# Patient Record
Sex: Female | Born: 1990 | Race: White | Hispanic: No | Marital: Single | Smoking: Former smoker
Health system: Southern US, Community
[De-identification: ages and names within clinical notes are randomized; demographics above are authoritative.]

## PROBLEM LIST (undated history)

## (undated) ENCOUNTER — Inpatient Hospital Stay (HOSPITAL_COMMUNITY): Payer: Self-pay

## (undated) HISTORY — PX: NO PAST SURGERIES: SHX2092

---
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
## 2011-01-30 LAB — GC/CHLAMYDIA PROBE AMP, GENITAL: GC Probe Amp, Genital: NEGATIVE

## 2011-01-30 LAB — URINALYSIS, ROUTINE W REFLEX MICROSCOPIC
Leukocytes, UA: NEGATIVE
Nitrite: NEGATIVE
Specific Gravity, Urine: 1.02 (ref 1.005–1.030)
Urobilinogen, UA: 0.2 mg/dL (ref 0.0–1.0)
pH: 7.5 (ref 5.0–8.0)

## 2011-01-30 LAB — URINE MICROSCOPIC-ADD ON

## 2011-02-18 ENCOUNTER — Inpatient Hospital Stay (HOSPITAL_COMMUNITY)
Admission: AD | Admit: 2011-02-18 | Discharge: 2011-02-21 | DRG: 775 | Disposition: A | Discharge status: Home / Self Care | Payer: 59 | Source: Ambulatory Visit | Attending: Obstetrics and Gynecology | Admitting: Obstetrics and Gynecology

## 2011-02-18 DIAGNOSIS — O4290 Premature rupture of membranes, unspecified as to length of time between rupture and onset of labor, unspecified weeks of gestation: Principal | ICD-10-CM | POA: Diagnosis present

## 2011-02-18 LAB — CBC
Hemoglobin: 12.6 g/dL (ref 12.0–15.0)
MCH: 31.2 pg (ref 26.0–34.0)
MCV: 90.6 fL (ref 78.0–100.0)
RBC: 4.04 MIL/uL (ref 3.87–5.11)
WBC: 10.1 10*3/uL (ref 4.0–10.5)

## 2011-02-18 LAB — RPR: RPR Ser Ql: NONREACTIVE

## 2011-02-20 LAB — CBC
Hemoglobin: 11.7 g/dL — ABNORMAL LOW (ref 12.0–15.0)
MCH: 30.5 pg (ref 26.0–34.0)
MCV: 91.1 fL (ref 78.0–100.0)
Platelets: 177 10*3/uL (ref 150–400)
RBC: 3.84 MIL/uL — ABNORMAL LOW (ref 3.87–5.11)
WBC: 12.8 10*3/uL — ABNORMAL HIGH (ref 4.0–10.5)

## 2011-02-22 LAB — DIFFERENTIAL
Basophils Absolute: 0 10*3/uL (ref 0.0–0.1)
Basophils Relative: 0 % (ref 0–1)
Eosinophils Absolute: 0.1 10*3/uL (ref 0.0–1.2)
Eosinophils Relative: 1 % (ref 0–5)
Monocytes Absolute: 1 10*3/uL (ref 0.2–1.2)
Monocytes Relative: 10 % (ref 3–11)
Neutro Abs: 6.5 10*3/uL (ref 1.7–8.0)

## 2011-02-22 LAB — CBC
HCT: 38 % (ref 36.0–49.0)
Hemoglobin: 13.4 g/dL (ref 12.0–16.0)
MCHC: 35.2 g/dL (ref 31.0–37.0)
MCV: 87.9 fL (ref 78.0–98.0)
RBC: 4.33 MIL/uL (ref 3.80–5.70)
RDW: 12.7 % (ref 11.4–15.5)

## 2011-02-22 LAB — URINALYSIS, ROUTINE W REFLEX MICROSCOPIC
Glucose, UA: NEGATIVE mg/dL
Ketones, ur: 15 mg/dL — AB
Nitrite: NEGATIVE
Specific Gravity, Urine: 1.03 (ref 1.005–1.030)
pH: 6 (ref 5.0–8.0)

## 2011-02-22 LAB — URINE MICROSCOPIC-ADD ON

## 2011-02-22 LAB — POCT I-STAT, CHEM 8
BUN: 6 mg/dL (ref 6–23)
Calcium, Ion: 1.18 mmol/L (ref 1.12–1.32)
Chloride: 105 mEq/L (ref 96–112)
Glucose, Bld: 100 mg/dL — ABNORMAL HIGH (ref 70–99)
HCT: 39 % (ref 36.0–49.0)
Potassium: 3.4 mEq/L — ABNORMAL LOW (ref 3.5–5.1)

## 2011-02-22 LAB — GC/CHLAMYDIA PROBE AMP, GENITAL: GC Probe Amp, Genital: NEGATIVE

## 2011-02-22 LAB — WET PREP, GENITAL

## 2011-10-04 ENCOUNTER — Other Ambulatory Visit (HOSPITAL_COMMUNITY)
Admission: RE | Admit: 2011-10-04 | Discharge: 2011-10-04 | Disposition: A | Discharge status: Home / Self Care | Payer: BC Managed Care – PPO | Source: Ambulatory Visit | Attending: Obstetrics and Gynecology | Admitting: Obstetrics and Gynecology

## 2011-10-04 ENCOUNTER — Other Ambulatory Visit: Payer: Self-pay | Admitting: Obstetrics and Gynecology

## 2011-10-04 DIAGNOSIS — Z01419 Encounter for gynecological examination (general) (routine) without abnormal findings: Secondary | ICD-10-CM | POA: Insufficient documentation

## 2012-03-30 ENCOUNTER — Emergency Department (HOSPITAL_COMMUNITY)
Admission: EM | Admit: 2012-03-30 | Discharge: 2012-03-31 | Disposition: A | Discharge status: Home / Self Care | Payer: Commercial Indemnity | Attending: Emergency Medicine | Admitting: Emergency Medicine

## 2012-03-30 ENCOUNTER — Emergency Department (HOSPITAL_COMMUNITY): Payer: Commercial Indemnity

## 2012-03-30 ENCOUNTER — Encounter (HOSPITAL_COMMUNITY): Payer: Self-pay | Admitting: Emergency Medicine

## 2012-03-30 DIAGNOSIS — R0602 Shortness of breath: Secondary | ICD-10-CM | POA: Insufficient documentation

## 2012-03-30 DIAGNOSIS — J45909 Unspecified asthma, uncomplicated: Secondary | ICD-10-CM | POA: Insufficient documentation

## 2012-03-30 DIAGNOSIS — R110 Nausea: Secondary | ICD-10-CM | POA: Insufficient documentation

## 2012-03-30 DIAGNOSIS — R079 Chest pain, unspecified: Secondary | ICD-10-CM | POA: Insufficient documentation

## 2012-03-30 DIAGNOSIS — Z87891 Personal history of nicotine dependence: Secondary | ICD-10-CM | POA: Insufficient documentation

## 2012-03-30 LAB — DIFFERENTIAL
Basophils Absolute: 0 10*3/uL (ref 0.0–0.1)
Basophils Relative: 0 % (ref 0–1)
Eosinophils Absolute: 0.1 10*3/uL (ref 0.0–0.7)
Monocytes Absolute: 0.9 10*3/uL (ref 0.1–1.0)
Monocytes Relative: 13 % — ABNORMAL HIGH (ref 3–12)
Neutro Abs: 3 10*3/uL (ref 1.7–7.7)
Neutrophils Relative %: 43 % (ref 43–77)

## 2012-03-30 LAB — POCT I-STAT TROPONIN I: Troponin i, poc: 0 ng/mL (ref 0.00–0.08)

## 2012-03-30 LAB — BASIC METABOLIC PANEL
BUN: 14 mg/dL (ref 6–23)
Chloride: 104 mEq/L (ref 96–112)
Creatinine, Ser: 0.78 mg/dL (ref 0.50–1.10)
GFR calc Af Amer: >90 mL/min (ref >90)
GFR calc non Af Amer: >90 mL/min (ref >90)
Potassium: 3.5 mEq/L (ref 3.5–5.1)

## 2012-03-30 LAB — CBC
MCH: 30.4 pg (ref 26.0–34.0)
MCHC: 35.9 g/dL (ref 30.0–36.0)
RDW: 12.7 % (ref 11.5–15.5)

## 2012-03-30 MED ORDER — ONDANSETRON 4 MG PO TBDP
8.0000 mg | ORAL_TABLET | Freq: Once | ORAL | Status: AC
  Administered 2012-03-30: 8 mg via ORAL
  Filled 2012-03-30: qty 2

## 2012-03-30 NOTE — ED Notes (Signed)
Dr. Campos at bedside   

## 2012-03-30 NOTE — ED Notes (Signed)
Patient complaining of mid-sternal chest pain, shortness of breath, dizziness, nausea, and eyelid swelling in the morning for the past two or three days.  Patient also reports tingling in hands and feet.  Patient denies pain at this time; reports light-headedness.  Patient alert and oriented x4; PERRL present.  Upon arrival to room, patient changed into gown.  Will continue to monitor.

## 2012-03-30 NOTE — ED Notes (Signed)
PT. REPORTS MID CHEST PAIN WITH SOB AND NAUSEA / DIZZINESS AND LIGHTHEADED FOR 2 DAYS .

## 2012-03-30 NOTE — ED Provider Notes (Signed)
History     CSN: [id number]  Arrival date & time 03/30/12  2105   First MD Initiated Contact with Patient 03/30/12 2309      Chief Complaint  Patient presents with  . Chest Pain     The history is provided by the patient.   patient reports 3-4 days of nausea lightheadedness and some chest tightness with mild shortness of breath.  She does have a history of asthma.  She used her albuterol inhaler twice a day with some improvement.  She denies wheezing or shortness of breath at this time.  Denies vomiting.  She has melena or hematochezia.  She's had urinary frequency without dysuria.  She's had no missed menstrual cycles.  She denies vaginal bleeding or vaginal discharge at this time.  She's had no syncope.  She simply reports when she stands up she becomes lightheaded and feels dizzy.  Her symptoms are mild in severity.  She has followup appointment scheduled with her primary care Dr. tomorrow however her chest tightness became more severe and she presents the ER for evaluation  Past Medical History  Diagnosis Date  . Asthma     History reviewed. No pertinent past surgical history.  No family history on file.  History  Substance Use Topics  . Smoking status: Former Games developer  . Smokeless tobacco: Not on file  . Alcohol Use: No    OB History    Grav Para Term Preterm Abortions TAB SAB Ect Mult Living                  Review of Systems  Cardiovascular: Positive for chest pain.  All other systems reviewed and are negative.    Allergies  Review of patient's allergies indicates no known allergies.  Home Medications   Current Outpatient Rx  Name Route Sig Dispense Refill  . ONDANSETRON HCL 8 MG PO TABS Oral Take 1 tablet (8 mg total) by mouth every 8 (eight) hours as needed for nausea. 12 tablet 0    BP 112/66  Pulse 72  Temp(Src) 98.9 F (37.2 C) (Oral)  Resp 17  SpO2 98%  LMP 03/28/2012  Physical Exam  Nursing note and vitals reviewed. Constitutional: She is  oriented to person, place, and time. She appears well-developed and well-nourished. No distress.  HENT:  Head: Normocephalic and atraumatic.  Eyes: EOM are normal.  Neck: Normal range of motion.  Cardiovascular: Normal rate, regular rhythm and normal heart sounds.   Pulmonary/Chest: Effort normal and breath sounds normal.  Abdominal: Soft. She exhibits no distension. There is no tenderness.  Musculoskeletal: Normal range of motion.  Neurological: She is alert and oriented to person, place, and time.  Skin: Skin is warm and dry.  Psychiatric: She has a normal mood and affect. Judgment normal.    ED Course  Procedures (including critical care time)   Date: 03/30/2012  Rate: 70  Rhythm: normal sinus rhythm  QRS Axis: normal  Intervals: normal  ST/T Wave abnormalities: normal  Conduction Disutrbances: none  Narrative Interpretation:   Old EKG Reviewed: No prior EKG available      Labs Reviewed  DIFFERENTIAL - Abnormal; Notable for the following:    Monocytes Relative 13 (*)    All other components within normal limits  URINALYSIS, WITH MICROSCOPIC - Abnormal; Notable for the following:    APPearance CLOUDY (*)    Bacteria, UA FEW (*)    All other components within normal limits  CBC  BASIC METABOLIC PANEL  POCT  I-STAT TROPONIN I  PREGNANCY, URINE  LAB REPORT - SCANNED   Dg Chest 2 View  03/30/2012  *RADIOLOGY REPORT*  Clinical Data: Chest pain.  Dizziness.  Nausea.  Shortness of breath.  CHEST - 2 VIEW  Comparison: None.  Findings: Cardiac and mediastinal contours appear normal.  The lungs appear clear.  No pleural effusion is identified.  IMPRESSION:  No significant abnormality identified.  Original Report Authenticated By: Dellia Cloud, M.D.     1. Nausea   2. Chest pain       MDM  Patient has normal labs EKG and chest x-ray.  Her history is not concerning for ACS for pulmonary embolism.  Her vital signs are normal shot sats are 98%.  Urine and urine  pregnancy pending at this time.  The patient is being given oral Zofran for her nausea and she will be orally hydrated.        Lyanne Co, MD 04/01/12 [phone]

## 2012-03-30 NOTE — ED Notes (Signed)
Patient currently sitting up in bed; no respiratory or acute distress noted.  Family present at bedside.  Updated patient on plan of care; informed patient that we are currently waiting on urine results.  Patient has no other questions or concerns; will continue to monitor.

## 2012-03-31 LAB — URINALYSIS, MICROSCOPIC ONLY
Bilirubin Urine: NEGATIVE
Glucose, UA: NEGATIVE mg/dL
Ketones, ur: NEGATIVE mg/dL
Leukocytes, UA: NEGATIVE
Protein, ur: NEGATIVE mg/dL
pH: 7 (ref 5.0–8.0)

## 2012-03-31 MED ORDER — ONDANSETRON HCL 8 MG PO TABS: 8.0000 mg | ORAL_TABLET | Freq: Three times a day (TID) | ORAL | Status: AC | PRN

## 2012-03-31 NOTE — ED Notes (Signed)
Patient given discharge paperwork; went over discharge instructions with patient.  Patient instructed to take zofran as directed, to follow up with her primary care physician, and to return to the ED for new, worsening, or concerning symptoms.

## 2012-03-31 NOTE — ED Notes (Signed)
Patient currently resting quietly in bed; no respiratory or acute distress noted.  Family present at bedside.  No further orders from EDP at this time.  Will continue to monitor.

## 2012-03-31 NOTE — Discharge Instructions (Signed)
Nausea, Adult Nausea is the feeling that you have an upset stomach or have to vomit. Nausea by itself is not likely a serious concern, but it may be an early sign of more serious medical problems. As nausea gets worse, it can lead to vomiting. If vomiting develops, there is the risk of dehydration.  CAUSES   Viral infections.   Food poisoning.   Medicines.   Pregnancy.   Motion sickness.   Migraine headaches.   Emotional distress.   Severe pain from any source.   Alcohol intoxication.  HOME CARE INSTRUCTIONS  Get plenty of rest.   Ask your caregiver about specific rehydration instructions.   Eat small amounts of food and sip liquids more often.   Take all medicines as told by your caregiver.  SEEK MEDICAL CARE IF:  You have not improved after 2 days, or you get worse.   You have a headache.  SEEK IMMEDIATE MEDICAL CARE IF:   You have a fever.   You faint.   You keep vomiting or have blood in your vomit.   You are extremely weak or dehydrated.   You have dark or bloody stools.   You have severe chest or abdominal pain.  MAKE SURE YOU:  Understand these instructions.   Will watch your condition.   Will get help right away if you are not doing well or get worse.  Document Released: 12/13/2004 Document Revised: 10/25/2011 Document Reviewed: 07/18/2011 Exit[employer] Patient Information 2012 [employer]. 

## 2012-11-06 LAB — OB RESULTS CONSOLE RPR: RPR: NONREACTIVE

## 2012-11-11 ENCOUNTER — Other Ambulatory Visit (HOSPITAL_COMMUNITY): Payer: Self-pay | Admitting: Obstetrics and Gynecology

## 2012-11-11 DIAGNOSIS — O3680X0 Pregnancy with inconclusive fetal viability, not applicable or unspecified: Secondary | ICD-10-CM

## 2012-11-17 ENCOUNTER — Ambulatory Visit (HOSPITAL_COMMUNITY)
Admission: RE | Admit: 2012-11-17 | Discharge: 2012-11-17 | Disposition: A | Discharge status: Home / Self Care | Payer: Commercial Indemnity | Source: Ambulatory Visit | Attending: Obstetrics and Gynecology | Admitting: Obstetrics and Gynecology

## 2012-11-17 DIAGNOSIS — O3680X0 Pregnancy with inconclusive fetal viability, not applicable or unspecified: Secondary | ICD-10-CM

## 2012-11-17 DIAGNOSIS — Z3689 Encounter for other specified antenatal screening: Secondary | ICD-10-CM | POA: Insufficient documentation

## 2012-11-19 NOTE — L&D Delivery Note (Signed)
Delivery Note  Cervix was complete at 1949, and pt began pushing shortly after, FHR w early variables, overall remained reassuring   At 8:26 PM a viable female was delivered via Vaginal, Spontaneous Delivery (Presentation: OA ;  ).  Delivered through loose double nuchal cord, shoulders delivered easily APGAR: 8, 9; weight: pending  Placenta status: Intact, Spontaneous.  Cord: 3 vessels with the following complications: None.  Cord pH: n/a   Anesthesia: Epidural  Episiotomy: None Lacerations: Perineal;1st degree R periurethral  Suture Repair: 4-0 monocryl  Est. Blood Loss (mL): 300  Mom to postpartum.  Baby to nursery-stable. Infant remains s-s w mom, both stable in recovery room Pt plans to pump and give breast milk  Routine PP orders   Keyion Knack M 07/03/2013, 9:23 PM

## 2012-12-11 ENCOUNTER — Other Ambulatory Visit: Payer: Self-pay | Admitting: Obstetrics and Gynecology

## 2012-12-11 ENCOUNTER — Other Ambulatory Visit (HOSPITAL_COMMUNITY)
Admission: RE | Admit: 2012-12-11 | Discharge: 2012-12-11 | Disposition: A | Discharge status: Home / Self Care | Payer: Commercial Indemnity | Source: Ambulatory Visit | Attending: Obstetrics and Gynecology | Admitting: Obstetrics and Gynecology

## 2012-12-11 DIAGNOSIS — Z113 Encounter for screening for infections with a predominantly sexual mode of transmission: Secondary | ICD-10-CM | POA: Insufficient documentation

## 2012-12-11 DIAGNOSIS — Z01419 Encounter for gynecological examination (general) (routine) without abnormal findings: Secondary | ICD-10-CM | POA: Insufficient documentation

## 2013-02-09 ENCOUNTER — Inpatient Hospital Stay (HOSPITAL_COMMUNITY)
Admission: AD | Admit: 2013-02-09 | Discharge: 2013-02-10 | Disposition: A | Discharge status: Home / Self Care | Payer: Commercial Indemnity | Source: Ambulatory Visit | Attending: Obstetrics and Gynecology | Admitting: Obstetrics and Gynecology

## 2013-02-09 ENCOUNTER — Inpatient Hospital Stay (HOSPITAL_COMMUNITY): Payer: Commercial Indemnity

## 2013-02-09 ENCOUNTER — Encounter (HOSPITAL_COMMUNITY): Payer: Self-pay | Admitting: *Deleted

## 2013-02-09 DIAGNOSIS — O2390 Unspecified genitourinary tract infection in pregnancy, unspecified trimester: Secondary | ICD-10-CM | POA: Insufficient documentation

## 2013-02-09 DIAGNOSIS — B9689 Other specified bacterial agents as the cause of diseases classified elsewhere: Secondary | ICD-10-CM | POA: Insufficient documentation

## 2013-02-09 DIAGNOSIS — O99891 Other specified diseases and conditions complicating pregnancy: Secondary | ICD-10-CM | POA: Insufficient documentation

## 2013-02-09 DIAGNOSIS — N760 Acute vaginitis: Secondary | ICD-10-CM | POA: Insufficient documentation

## 2013-02-09 DIAGNOSIS — N898 Other specified noninflammatory disorders of vagina: Secondary | ICD-10-CM | POA: Insufficient documentation

## 2013-02-09 DIAGNOSIS — R109 Unspecified abdominal pain: Secondary | ICD-10-CM | POA: Insufficient documentation

## 2013-02-09 DIAGNOSIS — A499 Bacterial infection, unspecified: Secondary | ICD-10-CM | POA: Insufficient documentation

## 2013-02-09 DIAGNOSIS — IMO0002 Reserved for concepts with insufficient information to code with codable children: Secondary | ICD-10-CM | POA: Insufficient documentation

## 2013-02-09 LAB — URINALYSIS, ROUTINE W REFLEX MICROSCOPIC
Leukocytes, UA: NEGATIVE
Nitrite: NEGATIVE
Protein, ur: NEGATIVE mg/dL
Specific Gravity, Urine: 1.025 (ref 1.005–1.030)
Urobilinogen, UA: 1 mg/dL (ref 0.0–1.0)

## 2013-02-09 LAB — WET PREP, GENITAL
Trich, Wet Prep: NONE SEEN
Yeast Wet Prep HPF POC: NONE SEEN

## 2013-02-09 NOTE — MAU Provider Note (Signed)
History     CSN: [id number]  Arrival date and time: 02/09/13 2152   First Provider Initiated Contact with Patient 02/09/13 2245      Chief Complaint  Patient presents with  . Rupture of Membranes  . Abdominal Pain   HPI  Pt is a G2P1001 at 17.5 wks IUP here with reports of leaking watery fluid for the last 3 days, cramping off/on for last 24 hours.  No report of vaginal bleeding.  Also reports swelling in her hands and feet.   Past Medical History  Diagnosis Date  . Asthma     Past Surgical History  Procedure Laterality Date  . No past surgeries      Family History  Problem Relation Age of Onset  . Cancer Mother   . Diabetes Father   . Cancer Sister   . Cancer Maternal Grandmother   . Diabetes Paternal Grandfather     History  Substance Use Topics  . Smoking status: Former Games developer  . Smokeless tobacco: Not on file  . Alcohol Use: No    Allergies: No Known Allergies  No prescriptions prior to admission    Review of Systems  Gastrointestinal: Positive for abdominal pain (cramping).  Genitourinary:       Leaking of fluid  Musculoskeletal:       Hands and feet swelling  Neurological: Positive for headaches.  All other systems reviewed and are negative.   Physical Exam   Blood pressure 118/76, pulse 79, temperature 98.8 F (37.1 C), temperature source Oral, resp. rate 16, height 5\' 5"  (1.651 m), weight 69.4 kg (153 lb), last menstrual period 03/28/2012, SpO2 100.00%.  Physical Exam  Constitutional: She is oriented to person, place, and time. She appears well-developed and well-nourished. No distress.  HENT:  Head: Normocephalic.  Neck: Normal range of motion. Neck supple.  Cardiovascular: Normal rate, regular rhythm and normal heart sounds.   Respiratory: Effort normal and breath sounds normal.  GI: Soft. There is no tenderness.  Genitourinary: No bleeding around the vagina. Vaginal discharge (mucusy) found.  Right lateral wall vaginal cyst  palpated; approx 3 cm, soft, fluctuant.  Musculoskeletal: Normal range of motion. She exhibits no edema.  Neurological: She is alert and oriented to person, place, and time.  Skin: Skin is warm and dry.   Cervix - closed   MAU Course  Procedures  Results for orders placed during the hospital encounter of 02/09/13 (from the past 24 hour(s))  URINALYSIS, ROUTINE W REFLEX MICROSCOPIC     Status: None   Collection Time    02/09/13 10:11 PM      Result Value Range   Color, Urine YELLOW  YELLOW   APPearance CLEAR  CLEAR   Specific Gravity, Urine 1.025  1.005 - 1.030   pH 6.0  5.0 - 8.0   Glucose, UA NEGATIVE  NEGATIVE mg/dL   Hgb urine dipstick NEGATIVE  NEGATIVE   Bilirubin Urine NEGATIVE  NEGATIVE   Ketones, ur NEGATIVE  NEGATIVE mg/dL   Protein, ur NEGATIVE  NEGATIVE mg/dL   Urobilinogen, UA 1.0  0.0 - 1.0 mg/dL   Nitrite NEGATIVE  NEGATIVE   Leukocytes, UA NEGATIVE  NEGATIVE  WET PREP, GENITAL     Status: Abnormal   Collection Time    02/09/13 11:00 PM      Result Value Range   Yeast Wet Prep HPF POC NONE SEEN  NONE SEEN   Trich, Wet Prep NONE SEEN  NONE SEEN   Clue Cells Wet Prep  HPF POC FEW (*) NONE SEEN   WBC, Wet Prep HPF POC FEW (*) NONE SEEN   Ultrasound: IMPRESSION:  1. Single living intrauterine fetus estimated at 18 weeks and 1  day gestation.  2. Normal amniotic fluid.  3. Normal cervical length.  4. Simple appearing vaginal cyst.  Consulted with Dr. Christell Constant > discharge to home Assessment and Plan  Bacterial Vaginosis Vaginal Cyst  Plan: RX Flagyl Follow-up in office.  [facility] 02/09/2013, 10:47 PM

## 2013-02-09 NOTE — MAU Note (Signed)
Pt reports leaking watery fluid for the last 3 days, cramping off/on for last 24 hours, also reports swelling in her hands and feet.

## 2013-02-10 DIAGNOSIS — A499 Bacterial infection, unspecified: Secondary | ICD-10-CM

## 2013-02-10 DIAGNOSIS — N760 Acute vaginitis: Secondary | ICD-10-CM

## 2013-02-10 MED ORDER — METRONIDAZOLE 500 MG PO TABS: 500.0000 mg | ORAL_TABLET | Freq: Two times a day (BID) | ORAL | Status: DC

## 2013-04-17 LAB — OB RESULTS CONSOLE RUBELLA ANTIBODY, IGM: Rubella: IMMUNE

## 2013-04-17 LAB — OB RESULTS CONSOLE RPR: RPR: NONREACTIVE

## 2013-04-17 LAB — OB RESULTS CONSOLE HEPATITIS B SURFACE ANTIGEN: Hepatitis B Surface Ag: NEGATIVE

## 2013-04-28 ENCOUNTER — Encounter (HOSPITAL_COMMUNITY): Payer: Self-pay | Admitting: Obstetrics and Gynecology

## 2013-04-28 ENCOUNTER — Inpatient Hospital Stay (HOSPITAL_COMMUNITY)
Admission: AD | Admit: 2013-04-28 | Discharge: 2013-04-28 | Disposition: A | Discharge status: Home / Self Care | Payer: Commercial Indemnity | Source: Ambulatory Visit | Attending: Obstetrics and Gynecology | Admitting: Obstetrics and Gynecology

## 2013-04-28 DIAGNOSIS — B373 Candidiasis of vulva and vagina: Secondary | ICD-10-CM

## 2013-04-28 DIAGNOSIS — B3731 Acute candidiasis of vulva and vagina: Secondary | ICD-10-CM

## 2013-04-28 DIAGNOSIS — O2390 Unspecified genitourinary tract infection in pregnancy, unspecified trimester: Secondary | ICD-10-CM | POA: Insufficient documentation

## 2013-04-28 DIAGNOSIS — O4702 False labor before 37 completed weeks of gestation, second trimester: Secondary | ICD-10-CM

## 2013-04-28 DIAGNOSIS — O4700 False labor before 37 completed weeks of gestation, unspecified trimester: Secondary | ICD-10-CM | POA: Insufficient documentation

## 2013-04-28 DIAGNOSIS — R109 Unspecified abdominal pain: Secondary | ICD-10-CM | POA: Insufficient documentation

## 2013-04-28 DIAGNOSIS — N949 Unspecified condition associated with female genital organs and menstrual cycle: Secondary | ICD-10-CM | POA: Insufficient documentation

## 2013-04-28 LAB — URINALYSIS, ROUTINE W REFLEX MICROSCOPIC
Bilirubin Urine: NEGATIVE
Glucose, UA: NEGATIVE mg/dL
Hgb urine dipstick: NEGATIVE
Ketones, ur: NEGATIVE mg/dL
Protein, ur: NEGATIVE mg/dL
pH: 7 (ref 5.0–8.0)

## 2013-04-28 LAB — URINE MICROSCOPIC-ADD ON

## 2013-04-28 LAB — WET PREP, GENITAL

## 2013-04-28 LAB — POCT FERN TEST: POCT Fern Test: NEGATIVE

## 2013-04-28 MED ORDER — TERCONAZOLE 0.8 % VA CREA: 1.0000 | TOPICAL_CREAM | Freq: Every day | VAGINAL | Status: DC

## 2013-04-28 NOTE — MAU Provider Note (Signed)
Chief Complaint:  Abdominal Pain and Vaginal Discharge   First Provider Initiated Contact with Patient 04/28/13 2127      HPI: Linda Swanson is a 22 y.o. G2P1001 at [redacted]w[redacted]d who presents to maternity admissions reporting gush of fluid that she thinks may have been urine at noon and cramping since then. Also reports white vaginal discharged x several days. Has it evaluated at the office. Told it was normal. Denies vaginal bleeding, urinary complaints, GI complatins. Good fetal movement.   Past Medical History: Past Medical History  Diagnosis Date  . Asthma     Past obstetric history: OB History   Grav Para Term Preterm Abortions TAB SAB Ect Mult Living   2 1 1       1      # Outc Date GA Lbr Len/2nd Wgt Sex Del Anes PTL Lv   1 TRM 4/12 [redacted]w[redacted]d  4.782NF([id number]) M SVD  Yes Yes   2 CUR               Past Surgical History: Past Surgical History  Procedure Laterality Date  . No past surgeries      Family History: Family History  Problem Relation Age of Onset  . Cancer Mother   . Diabetes Father   . Cancer Sister   . Cancer Maternal Grandmother   . Diabetes Paternal Grandfather     Social History: History  Substance Use Topics  . Smoking status: Former Games developer  . Smokeless tobacco: Not on file  . Alcohol Use: No    Allergies: No Known Allergies  Meds:  Prescriptions prior to admission  Medication Sig Dispense Refill  . Prenatal Vit-Fe Fumarate-FA (PRENATAL MULTIVITAMIN) TABS Take 1 tablet by mouth daily at 12 noon.        ROS: Pertinent findings in history of present illness.  Physical Exam  Blood pressure 121/66, pulse 80, resp. rate 18, last menstrual period 03/28/2012. GENERAL: Well-developed, well-nourished female in no acute distress.  HEENT: normocephalic HEART: normal rate RESP: normal effort ABDOMEN: Soft, non-tender, gravid appropriate for gestational age EXTREMITIES: Nontender, no edema NEURO: alert and oriented SPECULUM EXAM: NEFG, small amount of  thick, white, odorless discharge, no blood, cervix clean  Cervix long, closed, posterior.  FHT:  Baseline 130 , moderate variability, accelerations present, no decelerations Contractions: UI   Labs: Results for orders placed during the hospital encounter of 04/28/13 (from the past 24 hour(s))  WET PREP, GENITAL     Status: Abnormal   Collection Time    04/28/13  9:40 PM      Result Value Range   Yeast Wet Prep HPF POC NONE SEEN  NONE SEEN   Trich, Wet Prep NONE SEEN  NONE SEEN   Clue Cells Wet Prep HPF POC FEW (*) NONE SEEN   WBC, Wet Prep HPF POC MODERATE (*) NONE SEEN  URINALYSIS, ROUTINE W REFLEX MICROSCOPIC     Status: Abnormal   Collection Time    04/28/13 10:15 PM      Result Value Range   Color, Urine YELLOW  YELLOW   APPearance HAZY (*) CLEAR   Specific Gravity, Urine 1.020  1.005 - 1.030   pH 7.0  5.0 - 8.0   Glucose, UA NEGATIVE  NEGATIVE mg/dL   Hgb urine dipstick NEGATIVE  NEGATIVE   Bilirubin Urine NEGATIVE  NEGATIVE   Ketones, ur NEGATIVE  NEGATIVE mg/dL   Protein, ur NEGATIVE  NEGATIVE mg/dL   Urobilinogen, UA 0.2  0.0 - 1.0 mg/dL  Nitrite NEGATIVE  NEGATIVE   Leukocytes, UA SMALL (*) NEGATIVE  URINE MICROSCOPIC-ADD ON     Status: None   Collection Time    04/28/13 10:15 PM      Result Value Range   Squamous Epithelial / LPF RARE  RARE   WBC, UA 0-2  <3 WBC/hpf   RBC / HPF 0-2  <3 RBC/hpf  POCT FERN TEST     Status: None   Collection Time    04/28/13 10:16 PM      Result Value Range   POCT Fern Test Negative = intact amniotic membranes      Imaging:  No results found.  MAU Course:   Assessment: 1. Vulvovaginal candidiasis   2. Preterm contractions, second trimester     Plan: Discharge home per consult w/ Dr. Dion Body. Preterm labor precautions and fetal kick counts Follow-up Information   Follow up with Jessee Avers., MD. (as scheduled)    Contact information:   [address] [place] [address] [phone]        Follow up with [facility] [place] MATERNITY ADMISSIONS. (As needed if symptoms worsen)    Contact information:   [address] [id number] [place] [address] [phone]       Medication List    TAKE these medications       prenatal multivitamin Tabs  Take 1 tablet by mouth daily at 12 noon.     terconazole 0.8 % vaginal cream  Commonly known as:  TERAZOL 3  Place 1 applicator vaginally at bedtime.        [place], CNM 04/28/2013 11:14 PM

## 2013-04-28 NOTE — MAU Note (Signed)
Linda Swanson is here today with complaints of ?ROM, around 12:00 she had a gush of fluid where she thought she urinated on her self. She had to wear a pad. She is [redacted]w[redacted]d. Shortly after she started feeling cramping/pain in her abdomen.

## 2013-05-17 ENCOUNTER — Encounter (HOSPITAL_COMMUNITY): Payer: Self-pay | Admitting: *Deleted

## 2013-05-17 ENCOUNTER — Inpatient Hospital Stay (HOSPITAL_COMMUNITY)
Admission: AD | Admit: 2013-05-17 | Discharge: 2013-05-17 | Disposition: A | Discharge status: Home / Self Care | Payer: Managed Care, Other (non HMO) | Source: Ambulatory Visit | Attending: Obstetrics and Gynecology | Admitting: Obstetrics and Gynecology

## 2013-05-17 DIAGNOSIS — M545 Low back pain, unspecified: Secondary | ICD-10-CM | POA: Insufficient documentation

## 2013-05-17 DIAGNOSIS — O4703 False labor before 37 completed weeks of gestation, third trimester: Secondary | ICD-10-CM

## 2013-05-17 DIAGNOSIS — O479 False labor, unspecified: Secondary | ICD-10-CM

## 2013-05-17 DIAGNOSIS — O4700 False labor before 37 completed weeks of gestation, unspecified trimester: Secondary | ICD-10-CM | POA: Insufficient documentation

## 2013-05-17 LAB — URINALYSIS, ROUTINE W REFLEX MICROSCOPIC
Bilirubin Urine: NEGATIVE
Glucose, UA: NEGATIVE mg/dL
Ketones, ur: >80 mg/dL — AB
Protein, ur: NEGATIVE mg/dL
pH: 7 (ref 5.0–8.0)

## 2013-05-17 LAB — URINE MICROSCOPIC-ADD ON

## 2013-05-17 MED ORDER — NIFEDIPINE 10 MG PO CAPS
10.0000 mg | ORAL_CAPSULE | Freq: Once | ORAL | Status: AC
  Administered 2013-05-17: 10 mg via ORAL
  Filled 2013-05-17: qty 1

## 2013-05-17 MED ORDER — NIFEDIPINE 10 MG PO CAPS
ORAL_CAPSULE | ORAL | Status: AC
  Filled 2013-05-17: qty 1

## 2013-05-17 MED ORDER — DEXTROSE 5 % IN LACTATED RINGERS IV BOLUS
1000.0000 mL | Freq: Once | INTRAVENOUS | Status: AC
  Administered 2013-05-17: 1000 mL via INTRAVENOUS

## 2013-05-17 MED ORDER — NALBUPHINE HCL 10 MG/ML IJ SOLN
10.0000 mg | Freq: Once | INTRAMUSCULAR | Status: AC
  Administered 2013-05-17: 10 mg via INTRAVENOUS
  Filled 2013-05-17: qty 1

## 2013-05-17 MED ORDER — NIFEDIPINE ER OSMOTIC RELEASE 60 MG PO TB24: 60.0000 mg | ORAL_TABLET | Freq: Every day | ORAL | Status: DC

## 2013-05-17 MED ORDER — LACTATED RINGERS IV BOLUS (SEPSIS)
1000.0000 mL | Freq: Once | INTRAVENOUS | Status: AC
  Administered 2013-05-17: 1000 mL via INTRAVENOUS

## 2013-05-17 MED ORDER — NIFEDIPINE 10 MG PO CAPS
10.0000 mg | ORAL_CAPSULE | Freq: Once | ORAL | Status: AC
  Administered 2013-05-17: 10 mg via ORAL

## 2013-05-17 NOTE — MAU Provider Note (Signed)
History     CSN: [id number]  Arrival date and time: 05/17/13 1146   First Provider Initiated Contact with Patient 05/17/13 1212      No chief complaint on file.  HPI 22 y.o. G2P1001 at [redacted]w[redacted]d c/o contractions starting a few hours ago. Mostly feeling low back pain intermittently every few minutes. No bleeding or LOF. Uncomplicated prenatal course.   Past Medical History  Diagnosis Date  . Asthma     Past Surgical History  Procedure Laterality Date  . No past surgeries      Family History  Problem Relation Age of Onset  . Cancer Mother   . Diabetes Father   . Cancer Sister   . Cancer Maternal Grandmother   . Diabetes Paternal Grandfather     History  Substance Use Topics  . Smoking status: Former Games developer  . Smokeless tobacco: Not on file  . Alcohol Use: No    Allergies: No Known Allergies  Prescriptions prior to admission  Medication Sig Dispense Refill  . albuterol (PROVENTIL HFA;VENTOLIN HFA) 108 (90 BASE) MCG/ACT inhaler Inhale 2 puffs into the lungs every 6 (six) hours as needed for wheezing or shortness of breath.      . Prenatal Vit-Fe Fumarate-FA (PRENATAL MULTIVITAMIN) TABS Take 1 tablet by mouth daily.         Review of Systems  Constitutional: Negative.   Respiratory: Negative.   Cardiovascular: Negative.   Gastrointestinal: Negative for nausea, vomiting, abdominal pain, diarrhea and constipation.  Genitourinary: Negative for dysuria, urgency, frequency, hematuria and flank pain.       Negative for vaginal bleeding, + cramping/contractions  Musculoskeletal: Positive for back pain.  Neurological: Negative.   Psychiatric/Behavioral: Negative.    Physical Exam   Blood pressure 115/61, pulse 120, temperature 97.1 F (36.2 C), temperature source Oral, resp. rate 18, last menstrual period 03/28/2012.  Physical Exam  Nursing note and vitals reviewed. Constitutional: She is oriented to person, place, and time. She appears well-developed and  well-nourished. She appears distressed.  Cardiovascular: Normal rate.   Respiratory: Effort normal.  GI: Soft. There is no tenderness.  Genitourinary: No vaginal discharge found.  Dilation: 1.5 Effacement (%): 50 Exam by:: Dorene Grebe, CNM   Musculoskeletal: Normal range of motion.  Neurological: She is alert and oriented to person, place, and time.  Skin: Skin is warm and dry.  Psychiatric: She has a normal mood and affect.   EFM reactive, TOCO UC q 2-3 min  MAU Course  Procedures  Results for orders placed during the hospital encounter of 05/17/13 (from the past 24 hour(s))  FETAL FIBRONECTIN     Status: None   Collection Time    05/17/13 12:00 PM      Result Value Range   Fetal Fibronectin NEGATIVE  NEGATIVE  URINALYSIS, ROUTINE W REFLEX MICROSCOPIC     Status: Abnormal   Collection Time    05/17/13 12:25 PM      Result Value Range   Color, Urine YELLOW  YELLOW   APPearance CLEAR  CLEAR   Specific Gravity, Urine 1.015  1.005 - 1.030   pH 7.0  5.0 - 8.0   Glucose, UA NEGATIVE  NEGATIVE mg/dL   Hgb urine dipstick TRACE (*) NEGATIVE   Bilirubin Urine NEGATIVE  NEGATIVE   Ketones, ur >80 (*) NEGATIVE mg/dL   Protein, ur NEGATIVE  NEGATIVE mg/dL   Urobilinogen, UA 0.2  0.0 - 1.0 mg/dL   Nitrite NEGATIVE  NEGATIVE   Leukocytes, UA SMALL (*) NEGATIVE  URINE MICROSCOPIC-ADD ON     Status: Abnormal   Collection Time    05/17/13 12:25 PM      Result Value Range   Squamous Epithelial / LPF RARE  RARE   WBC, UA 0-2  <3 WBC/hpf   RBC / HPF 0-2  <3 RBC/hpf   Bacteria, UA FEW (*) RARE   Pt feeling better after IV hydration with 1 L LR, Procardia 10 mg PO and Nubain 10 mg IV. 2nd liter of D5LR given d/t >80 ketones on UA. Contractions initally spaced out, then began to return, 2nd dose of Procardia 10 mg PO given. Pt feeling better, contractions subsiding.   Assessment and Plan   1. Preterm contractions, third trimester   FFN negative, UCs resolved. Precautions rev'd. F/U as  scheduled or sooner PRN. Rx Procardia-XL - may take 1 po qday PRN if contractions recur. Encouraged good hydration.     Medication List         albuterol 108 (90 BASE) MCG/ACT inhaler  Commonly known as:  PROVENTIL HFA;VENTOLIN HFA  Inhale 2 puffs into the lungs every 6 (six) hours as needed for wheezing or shortness of breath.     NIFEdipine 60 MG 24 hr tablet  Commonly known as:  PROCARDIA XL/ADALAT-CC  Take 1 tablet (60 mg total) by mouth daily.     prenatal multivitamin Tabs  Take 1 tablet by mouth daily.        Follow-up Information   Follow up with Jessee Avers., MD. (as scheduled)    Contact information:   [address] [place] [address] [phone]         Linda Swanson 05/17/2013, 2:20 PM

## 2013-05-17 NOTE — MAU Note (Signed)
Pt. Had 3 contractions last night at work. Then today around 0930 back pain/contractions began. Here due to increased pain and pressure. Unable to time. Denies blood or fluid leakage. Baby has been moving ok today. Has decreased today but can still feel baby well.

## 2013-06-18 LAB — OB RESULTS CONSOLE GBS: GBS: POSITIVE

## 2013-06-24 ENCOUNTER — Inpatient Hospital Stay (HOSPITAL_COMMUNITY)
Admission: AD | Admit: 2013-06-24 | Discharge: 2013-06-24 | Disposition: A | Discharge status: Home / Self Care | Payer: Managed Care, Other (non HMO) | Source: Ambulatory Visit | Attending: Obstetrics and Gynecology | Admitting: Obstetrics and Gynecology

## 2013-06-24 ENCOUNTER — Encounter (HOSPITAL_COMMUNITY): Payer: Self-pay | Admitting: *Deleted

## 2013-06-24 DIAGNOSIS — O99891 Other specified diseases and conditions complicating pregnancy: Secondary | ICD-10-CM | POA: Insufficient documentation

## 2013-06-24 LAB — AMNISURE RUPTURE OF MEMBRANE (ROM) NOT AT ARMC: Amnisure ROM: NEGATIVE

## 2013-06-24 LAB — POCT FERN TEST: POCT Fern Test: NEGATIVE

## 2013-06-24 NOTE — MAU Note (Signed)
Pt presents with complaints of leakage of fluid that started today around 10 am. States that she kept an eye on the leakage of fluid because she wasn't sure if it was urine or not. She noticed more leakage this evening. Denies any bleeding

## 2013-06-25 ENCOUNTER — Inpatient Hospital Stay (HOSPITAL_COMMUNITY)
Admission: AD | Admit: 2013-06-25 | Discharge: 2013-06-25 | Disposition: A | Discharge status: Home / Self Care | Payer: Managed Care, Other (non HMO) | Source: Ambulatory Visit | Attending: Obstetrics and Gynecology | Admitting: Obstetrics and Gynecology

## 2013-06-25 DIAGNOSIS — O368190 Decreased fetal movements, unspecified trimester, not applicable or unspecified: Secondary | ICD-10-CM

## 2013-06-25 DIAGNOSIS — O368130 Decreased fetal movements, third trimester, not applicable or unspecified: Secondary | ICD-10-CM

## 2013-06-25 DIAGNOSIS — R109 Unspecified abdominal pain: Secondary | ICD-10-CM | POA: Insufficient documentation

## 2013-06-25 NOTE — MAU Note (Signed)
Baby has not been moving as much. Has been having some cramping in pelvis and legs.  Was here last night for ? rom , was ruled out- told might have some spotting after exam; had a glob of blood come out at one time.

## 2013-06-25 NOTE — MAU Provider Note (Signed)
  History     CSN: [id number]  Arrival date and time: 06/25/13 1018   None     Chief Complaint  Patient presents with  . Decreased Fetal Movement   HPI Pt is [redacted]w[redacted]d pregnant and presents with decreased fetal movement over past 2 weeks.  Pt has pressure and has had some vaginal discharge and was here yesterday and was checked for ROM.  No gush of fluid.   Past Medical History  Diagnosis Date  . Asthma     Past Surgical History  Procedure Laterality Date  . No past surgeries      Family History  Problem Relation Age of Onset  . Cancer Mother   . Diabetes Father   . Cancer Sister   . Cancer Maternal Grandmother   . Diabetes Paternal Grandfather     History  Substance Use Topics  . Smoking status: Former Games developer  . Smokeless tobacco: Not on file  . Alcohol Use: No    Allergies: No Known Allergies  Prescriptions prior to admission  Medication Sig Dispense Refill  . albuterol (PROVENTIL HFA;VENTOLIN HFA) 108 (90 BASE) MCG/ACT inhaler Inhale 2 puffs into the lungs every 6 (six) hours as needed for wheezing or shortness of breath.      Marland Kitchen NIFEdipine (PROCARDIA XL/ADALAT-CC) 60 MG 24 hr tablet Take 1 tablet (60 mg total) by mouth daily.  20 tablet  0  . Prenatal Vit-Fe Fumarate-FA (PRENATAL MULTIVITAMIN) TABS Take 1 tablet by mouth daily.         ROS Physical Exam   Blood pressure 122/72, pulse 78, temperature 98 F (36.7 C), temperature source Oral, resp. rate 18, last menstrual period 03/28/2012.  Physical Exam Pt alert and oriented in no acute distress NST reactive with no decelerations; occ ctx  MAU Course  Procedures Discussed NST and pt with Dr. Richardson Dopp Pt may d/c home  Assessment and Plan  Decreased fetal movement Kick counts- f/u for recurrence of decrease FM, pain, bleeding or LOF Keep OB scheduled appt  LINEBERRY,SUSAN 06/25/2013, 11:36 AM ..................[id number]

## 2013-06-25 NOTE — MAU Note (Signed)
Pt states cervix was 3cm yesterday here. Unsure of effacement.

## 2013-07-03 ENCOUNTER — Inpatient Hospital Stay (HOSPITAL_COMMUNITY)
Admission: AD | Admit: 2013-07-03 | Discharge: 2013-07-05 | DRG: 775 | Disposition: A | Discharge status: Home / Self Care | Payer: Managed Care, Other (non HMO) | Source: Ambulatory Visit | Attending: Obstetrics and Gynecology | Admitting: Obstetrics and Gynecology

## 2013-07-03 ENCOUNTER — Encounter (HOSPITAL_COMMUNITY): Payer: Self-pay | Admitting: *Deleted

## 2013-07-03 ENCOUNTER — Encounter (HOSPITAL_COMMUNITY): Payer: Self-pay | Admitting: Anesthesiology

## 2013-07-03 ENCOUNTER — Inpatient Hospital Stay (HOSPITAL_COMMUNITY): Payer: Managed Care, Other (non HMO) | Admitting: Anesthesiology

## 2013-07-03 DIAGNOSIS — O99892 Other specified diseases and conditions complicating childbirth: Secondary | ICD-10-CM | POA: Diagnosis present

## 2013-07-03 DIAGNOSIS — Z22330 Carrier of Group B streptococcus: Secondary | ICD-10-CM

## 2013-07-03 LAB — CBC
HCT: 34.3 % — ABNORMAL LOW (ref 36.0–46.0)
Hemoglobin: 11.8 g/dL — ABNORMAL LOW (ref 12.0–15.0)
MCH: 29.9 pg (ref 26.0–34.0)
MCHC: 34.4 g/dL (ref 30.0–36.0)
MCV: 87.1 fL (ref 78.0–100.0)
Platelets: 239 K/uL (ref 150–400)
RBC: 3.94 MIL/uL (ref 3.87–5.11)
RDW: 12.9 % (ref 11.5–15.5)
WBC: 10.4 K/uL (ref 4.0–10.5)

## 2013-07-03 MED ORDER — ACETAMINOPHEN 325 MG PO TABS: 650.0000 mg | ORAL_TABLET | ORAL | Status: DC | PRN

## 2013-07-03 MED ORDER — TERBUTALINE SULFATE 1 MG/ML IJ SOLN: 0.2500 mg | Freq: Once | INTRAMUSCULAR | Status: DC | PRN

## 2013-07-03 MED ORDER — LACTATED RINGERS IV SOLN: INTRAVENOUS | Status: DC

## 2013-07-03 MED ORDER — ONDANSETRON HCL 4 MG/2ML IJ SOLN: 4.0000 mg | Freq: Four times a day (QID) | INTRAMUSCULAR | Status: DC | PRN

## 2013-07-03 MED ORDER — TETANUS-DIPHTH-ACELL PERTUSSIS 5-2.5-18.5 LF-MCG/0.5 IM SUSP
0.5000 mL | Freq: Once | INTRAMUSCULAR | Status: AC
  Administered 2013-07-04: 0.5 mL via INTRAMUSCULAR

## 2013-07-03 MED ORDER — LIDOCAINE HCL (PF) 1 % IJ SOLN
INTRAMUSCULAR | Status: DC | PRN
  Administered 2013-07-03 (×2): 5 mL

## 2013-07-03 MED ORDER — OXYTOCIN 40 UNITS IN LACTATED RINGERS INFUSION - SIMPLE MED
1.0000 m[IU]/min | INTRAVENOUS | Status: DC
  Administered 2013-07-03: 4 m[IU]/min via INTRAVENOUS
  Administered 2013-07-03: 6 m[IU]/min via INTRAVENOUS
  Administered 2013-07-03: 2 m[IU]/min via INTRAVENOUS
  Administered 2013-07-03: 8 m[IU]/min via INTRAVENOUS
  Administered 2013-07-03: 10 m[IU]/min via INTRAVENOUS
  Administered 2013-07-03: 16 m[IU]/min via INTRAVENOUS

## 2013-07-03 MED ORDER — FLEET ENEMA 7-19 GM/118ML RE ENEM: 1.0000 | ENEMA | RECTAL | Status: DC | PRN

## 2013-07-03 MED ORDER — SIMETHICONE 80 MG PO CHEW: 80.0000 mg | CHEWABLE_TABLET | ORAL | Status: DC | PRN

## 2013-07-03 MED ORDER — PRENATAL MULTIVITAMIN CH: 1.0000 | ORAL_TABLET | Freq: Every day | ORAL | Status: DC

## 2013-07-03 MED ORDER — ONDANSETRON HCL 4 MG PO TABS: 4.0000 mg | ORAL_TABLET | ORAL | Status: DC | PRN

## 2013-07-03 MED ORDER — OXYCODONE-ACETAMINOPHEN 5-325 MG PO TABS
1.0000 | ORAL_TABLET | ORAL | Status: DC | PRN
  Administered 2013-07-04: 1 via ORAL
  Filled 2013-07-03: qty 1

## 2013-07-03 MED ORDER — PHENYLEPHRINE 40 MCG/ML (10ML) SYRINGE FOR IV PUSH (FOR BLOOD PRESSURE SUPPORT)
80.0000 ug | PREFILLED_SYRINGE | INTRAVENOUS | Status: DC | PRN
  Filled 2013-07-03: qty 5

## 2013-07-03 MED ORDER — WITCH HAZEL-GLYCERIN EX PADS: 1.0000 "application " | MEDICATED_PAD | CUTANEOUS | Status: DC | PRN

## 2013-07-03 MED ORDER — LIDOCAINE HCL (PF) 1 % IJ SOLN
30.0000 mL | INTRAMUSCULAR | Status: DC | PRN
  Administered 2013-07-03: 30 mL via SUBCUTANEOUS
  Filled 2013-07-03 (×2): qty 30

## 2013-07-03 MED ORDER — MEDROXYPROGESTERONE ACETATE 150 MG/ML IM SUSP: 150.0000 mg | INTRAMUSCULAR | Status: DC | PRN

## 2013-07-03 MED ORDER — DIPHENHYDRAMINE HCL 25 MG PO CAPS: 25.0000 mg | ORAL_CAPSULE | Freq: Four times a day (QID) | ORAL | Status: DC | PRN

## 2013-07-03 MED ORDER — SENNOSIDES-DOCUSATE SODIUM 8.6-50 MG PO TABS
2.0000 | ORAL_TABLET | Freq: Every day | ORAL | Status: DC
  Administered 2013-07-04 (×2): 2 via ORAL

## 2013-07-03 MED ORDER — FENTANYL 2.5 MCG/ML BUPIVACAINE 1/10 % EPIDURAL INFUSION (WH - ANES)
14.0000 mL/h | INTRAMUSCULAR | Status: DC | PRN
  Administered 2013-07-03: 14 mL/h via EPIDURAL
  Filled 2013-07-03: qty 125

## 2013-07-03 MED ORDER — ZOLPIDEM TARTRATE 5 MG PO TABS: 5.0000 mg | ORAL_TABLET | Freq: Every evening | ORAL | Status: DC | PRN

## 2013-07-03 MED ORDER — FLEET ENEMA 7-19 GM/118ML RE ENEM: 1.0000 | ENEMA | Freq: Every day | RECTAL | Status: DC | PRN

## 2013-07-03 MED ORDER — ONDANSETRON HCL 4 MG/2ML IJ SOLN: 4.0000 mg | INTRAMUSCULAR | Status: DC | PRN

## 2013-07-03 MED ORDER — PENICILLIN G POTASSIUM 5000000 UNITS IJ SOLR
2.5000 10*6.[IU] | INTRAVENOUS | Status: DC
  Administered 2013-07-03: 2.5 10*6.[IU] via INTRAVENOUS
  Filled 2013-07-03 (×4): qty 2.5

## 2013-07-03 MED ORDER — LANOLIN HYDROUS EX OINT: TOPICAL_OINTMENT | CUTANEOUS | Status: DC | PRN

## 2013-07-03 MED ORDER — MISOPROSTOL 200 MCG PO TABS
800.0000 ug | ORAL_TABLET | Freq: Once | ORAL | Status: AC
  Administered 2013-07-03: 800 ug via RECTAL

## 2013-07-03 MED ORDER — EPHEDRINE 5 MG/ML INJ: 10.0000 mg | INTRAVENOUS | Status: DC | PRN

## 2013-07-03 MED ORDER — OXYCODONE-ACETAMINOPHEN 5-325 MG PO TABS: 1.0000 | ORAL_TABLET | ORAL | Status: DC | PRN

## 2013-07-03 MED ORDER — DIBUCAINE 1 % RE OINT: 1.0000 "application " | TOPICAL_OINTMENT | RECTAL | Status: DC | PRN

## 2013-07-03 MED ORDER — MEASLES, MUMPS & RUBELLA VAC ~~LOC~~ INJ: 0.5000 mL | INJECTION | Freq: Once | SUBCUTANEOUS | Status: DC

## 2013-07-03 MED ORDER — PRENATAL MULTIVITAMIN CH
1.0000 | ORAL_TABLET | Freq: Every day | ORAL | Status: DC
  Administered 2013-07-04 – 2013-07-05 (×2): 1 via ORAL
  Filled 2013-07-03 (×2): qty 1

## 2013-07-03 MED ORDER — DIPHENHYDRAMINE HCL 50 MG/ML IJ SOLN: 12.5000 mg | INTRAMUSCULAR | Status: DC | PRN

## 2013-07-03 MED ORDER — IBUPROFEN 600 MG PO TABS
600.0000 mg | ORAL_TABLET | Freq: Four times a day (QID) | ORAL | Status: DC
  Administered 2013-07-04 – 2013-07-05 (×6): 600 mg via ORAL
  Filled 2013-07-03 (×6): qty 1

## 2013-07-03 MED ORDER — OXYTOCIN BOLUS FROM INFUSION: 500.0000 mL | INTRAVENOUS | Status: DC

## 2013-07-03 MED ORDER — LACTATED RINGERS IV SOLN
500.0000 mL | INTRAVENOUS | Status: DC | PRN
  Administered 2013-07-03: 1000 mL via INTRAVENOUS

## 2013-07-03 MED ORDER — PHENYLEPHRINE 40 MCG/ML (10ML) SYRINGE FOR IV PUSH (FOR BLOOD PRESSURE SUPPORT): 80.0000 ug | PREFILLED_SYRINGE | INTRAVENOUS | Status: DC | PRN

## 2013-07-03 MED ORDER — EPHEDRINE 5 MG/ML INJ
10.0000 mg | INTRAVENOUS | Status: DC | PRN
  Filled 2013-07-03: qty 4

## 2013-07-03 MED ORDER — PENICILLIN G POTASSIUM 5000000 UNITS IJ SOLR
5.0000 10*6.[IU] | Freq: Once | INTRAMUSCULAR | Status: AC
  Administered 2013-07-03: 5 10*6.[IU] via INTRAVENOUS
  Filled 2013-07-03: qty 5

## 2013-07-03 MED ORDER — IBUPROFEN 600 MG PO TABS: 600.0000 mg | ORAL_TABLET | Freq: Four times a day (QID) | ORAL | Status: DC | PRN

## 2013-07-03 MED ORDER — MISOPROSTOL 200 MCG PO TABS
ORAL_TABLET | ORAL | Status: AC
  Filled 2013-07-03: qty 4

## 2013-07-03 MED ORDER — BISACODYL 10 MG RE SUPP: 10.0000 mg | Freq: Every day | RECTAL | Status: DC | PRN

## 2013-07-03 MED ORDER — LACTATED RINGERS IV SOLN
500.0000 mL | Freq: Once | INTRAVENOUS | Status: AC
  Administered 2013-07-03: 500 mL via INTRAVENOUS

## 2013-07-03 MED ORDER — OXYTOCIN 40 UNITS IN LACTATED RINGERS INFUSION - SIMPLE MED
62.5000 mL/h | INTRAVENOUS | Status: DC
  Filled 2013-07-03: qty 1000

## 2013-07-03 MED ORDER — BENZOCAINE-MENTHOL 20-0.5 % EX AERO
1.0000 "application " | INHALATION_SPRAY | CUTANEOUS | Status: DC | PRN
  Administered 2013-07-04: 1 via TOPICAL
  Filled 2013-07-03: qty 56

## 2013-07-03 MED ORDER — CITRIC ACID-SODIUM CITRATE 334-500 MG/5ML PO SOLN: 30.0000 mL | ORAL | Status: DC | PRN

## 2013-07-03 NOTE — MAU Note (Signed)
Patient states she had a gush of clear fluid at 0830 that continues with sneezing or coughing. Having a lot of back pain and pressure, not sure if contractions. States baby is moving but less than usual.

## 2013-07-03 NOTE — Anesthesia Preprocedure Evaluation (Signed)
Anesthesia Evaluation  Patient identified by MRN, date of birth, ID band Patient awake    Reviewed: Allergy & Precautions, H&P , NPO status , Patient's Chart, lab work & pertinent test results  Airway Mallampati: II      Dental no notable dental hx.    Pulmonary neg pulmonary ROS, asthma ,  breath sounds clear to auscultation  Pulmonary exam normal       Cardiovascular Exercise Tolerance: Good negative cardio ROS  Rhythm:regular Rate:Normal     Neuro/Psych negative neurological ROS  negative psych ROS   GI/Hepatic negative GI ROS, Neg liver ROS,   Endo/Other  negative endocrine ROS  Renal/GU negative Renal ROS  negative genitourinary   Musculoskeletal   Abdominal Normal abdominal exam  (+)   Peds  Hematology negative hematology ROS (+)   Anesthesia Other Findings   Reproductive/Obstetrics (+) Pregnancy                           Anesthesia Physical Anesthesia Plan  ASA: II  Anesthesia Plan: Epidural   Post-op Pain Management:    Induction:   Airway Management Planned:   Additional Equipment:   Intra-op Plan:   Post-operative Plan:   Informed Consent: I have reviewed the patients History and Physical, chart, labs and discussed the procedure including the risks, benefits and alternatives for the proposed anesthesia with the patient or authorized representative who has indicated his/her understanding and acceptance.     Plan Discussed with: Anesthesiologist, CRNA and Surgeon  Anesthesia Plan Comments:         Anesthesia Quick Evaluation

## 2013-07-03 NOTE — Anesthesia Procedure Notes (Signed)
Epidural Patient location during procedure: OB Start time: 07/03/2013 5:09 PM  Staffing Anesthesiologist: Angus Seller., Harrell Gave. Performed by: anesthesiologist   Preanesthetic Checklist Completed: patient identified, site marked, surgical consent, pre-op evaluation, timeout performed, IV checked, risks and benefits discussed and monitors and equipment checked  Epidural Patient position: sitting Prep: site prepped and draped and DuraPrep Patient monitoring: continuous pulse ox and blood pressure Approach: midline Injection technique: LOR air and LOR saline  Needle:  Needle type: Tuohy  Needle gauge: 17 G Needle length: 9 cm and 9 Needle insertion depth: 5 cm cm Catheter type: closed end flexible Catheter size: 19 Gauge Catheter at skin depth: 10 cm Test dose: negative  Assessment Events: blood not aspirated, injection not painful, no injection resistance, negative IV test and no paresthesia  Additional Notes Patient identified.  Risk benefits discussed including failed block, incomplete pain control, headache, nerve damage, paralysis, blood pressure changes, nausea, vomiting, reactions to medication both toxic or allergic, and postpartum back pain.  Patient expressed understanding and wished to proceed.  All questions were answered.  Sterile technique used throughout procedure and epidural site dressed with sterile barrier dressing. No paresthesia or other complications noted.The patient did not experience any signs of intravascular injection such as tinnitus or metallic taste in mouth nor signs of intrathecal spread such as rapid motor block. Please see nursing notes for vital signs.

## 2013-07-03 NOTE — H&P (Signed)
Linda Swanson is a 22 y.o. female G2 P1001 at 38 wks and 2 days  presenting   Complaining of  Contractions every 2-4 minutes. She felt a gush of fluid at 8 am however her amniosure was negative. She changed from 3.5 to 4.5 in MAU. + fm bloody show.   History OB History   Grav Para Term Preterm Abortions TAB SAB Ect Mult Living   2 1 1       1      Past Medical History  Diagnosis Date  . Asthma    Past Surgical History  Procedure Laterality Date  . No past surgeries     Family History: family history includes Cancer in her maternal grandmother, mother, and sister; Diabetes in her father and paternal grandfather. Social History:  reports that she has quit smoking. She does not have any smokeless tobacco history on file. She reports that she does not drink alcohol or use illicit drugs.   Prenatal Transfer Tool  Maternal Diabetes: No Genetic Screening: Normal Maternal Ultrasounds/Referrals: Normal Fetal Ultrasounds or other Referrals:  None Maternal Substance Abuse:  No Significant Maternal Medications:  None Significant Maternal Lab Results:  Lab values include: Group B Strep positive Chamydia positive 06/18/2013.Marland Kitchen Pt treated 06/2013...  Other Comments:  None  ROS  Dilation: 4.5 Effacement (%): 70 Station: -2 Exam by:: Arbie Cookey, CNM Blood pressure 112/69, pulse 66, temperature 97.9 F (36.6 C), temperature source Oral, resp. rate 18, height 5\' 4"  (1.626 m), weight 78.019 kg (172 lb), last menstrual period 03/28/2012, SpO2 100.00%. Exam Physical Exam  Vitals reviewed. Constitutional: She is oriented to person, place, and time. She appears well-developed and well-nourished.  Neck: Normal range of motion.  Cardiovascular: Normal rate and regular rhythm.   Respiratory: Effort normal and breath sounds normal.  GI: There is no tenderness.  Genitourinary: Vagina normal and uterus normal.  Musculoskeletal: Normal range of motion.  Neurological: She is alert and oriented  to person, place, and time.  Skin: Skin is warm and dry.  Psychiatric: She has a normal mood and affect.    Category 1 tracing ...   Prenatal labs: ABO, Rh:   O positive  Antibody:    Negative  Rubella:  Immune  RPR:   Nonreactive  HBsAg:   Negative  HIV:   Nonreactive  GBS: Positive (07/31 0000)   Assessment/Plan: 38 wks and 2 days with Labor...  Pitocin for augmentation. Anticipate svd  GBS Positive.  Penicillin for prophylaxis    chlamydia positive treated with Azithromycin  06/29/2013...   Roald Lukacs J. 07/03/2013, 12:54 PM

## 2013-07-03 NOTE — Progress Notes (Signed)
Linda Swanson is a 22 y.o. G2P1001 at [redacted]w[redacted]d by  5 wk 5 day u/s with [facility] 07/15/2013 admitted for active labor  Subjective:  Pt comfortable with epidural. +FM   Objective: BP 114/64  Pulse 61  Temp(Src) 97.9 F (36.6 C) (Oral)  Resp 18  Ht 5\' 4"  (1.626 m)  Wt 78.019 kg (172 lb)  BMI 29.51 kg/m2  SpO2 100%  LMP 03/28/2012      FHT:  FHR: 130 bpm, variability: moderate,  accelerations:  Present,  decelerations:  Absent UC:   regular, every 2 minutes SVE:   Dilation: 6 Effacement (%): 70 Station: -2 Exam by:: Roshini Fulwider  IUPC placed without difficulty   Labs: Lab Results  Component Value Date   WBC 10.4 07/03/2013   HGB 11.8* 07/03/2013   HCT 34.3* 07/03/2013   MCV 87.1 07/03/2013   PLT 239 07/03/2013    Assessment / Plan: Augmentation of labor, progressing well  Labor: Progressing on Pitocin Preeclampsia:  NA Fetal Wellbeing:  Category I Pain Control:  Epidural I/D:  Penicillin Anticipated MOD:  NSVD  [name] 07/03/2013, 5:54 PM

## 2013-07-03 NOTE — Progress Notes (Signed)
Linda Swanson is a 22 y.o. G2P1001 at [redacted]w[redacted]d by ultrasound admitted for active labor  Subjective:  Feeling pressure . Comfortable with epidural   Objective: BP 103/61  Pulse 74  Temp(Src) 98.2 F (36.8 C) (Oral)  Resp 18  Ht 5\' 4"  (1.626 m)  Wt 78.019 kg (172 lb)  BMI 29.51 kg/m2  SpO2 100%  LMP 03/28/2012      FHT:  FHR: 130 bpm, variability: moderate,  accelerations:  Present,  decelerations:  Absent UC:   regular, every 2 minutes SVE:   Dilation: 7 Effacement (%): 80 Station: -2 Exam by:: [employer]: Lab Results  Component Value Date   WBC 10.4 07/03/2013   HGB 11.8* 07/03/2013   HCT 34.3* 07/03/2013   MCV 87.1 07/03/2013   PLT 239 07/03/2013    Assessment / Plan: Augmentation of labor, progressing well  Labor: Progressing on pitocin  Preeclampsia:  NA Fetal Wellbeing:  Category I Pain Control:  Epidural I/D:  Penicillin  Anticipated MOD:  NSVD [facility] midwife Lillard and Dr. Su Hilt covering after 7pm  Pershing Skidmore J. 07/03/2013, 6:57 PM

## 2013-07-04 LAB — CBC
HCT: 29.1 % — ABNORMAL LOW (ref 36.0–46.0)
Hemoglobin: 10 g/dL — ABNORMAL LOW (ref 12.0–15.0)
MCHC: 34.4 g/dL (ref 30.0–36.0)
MCV: 87.4 fL (ref 78.0–100.0)
RDW: 13.1 % (ref 11.5–15.5)

## 2013-07-04 MED ORDER — PNEUMOCOCCAL VAC POLYVALENT 25 MCG/0.5ML IJ INJ
0.5000 mL | INJECTION | INTRAMUSCULAR | Status: DC
  Filled 2013-07-04: qty 0.5

## 2013-07-04 NOTE — Anesthesia Postprocedure Evaluation (Signed)
Anesthesia Post Note  Patient: Linda Swanson  Procedure(s) Performed: * No procedures listed *  Anesthesia type: Epidural  Patient location: Mother/Baby  Post pain: Pain level controlled  Post assessment: Post-op Vital signs reviewed  Last Vitals:  Filed Vitals:   07/04/13 0639  BP: 95/64  Pulse: 70  Temp: 36.7 C  Resp: 16    Post vital signs: Reviewed  Level of consciousness:alert  Complications: No apparent anesthesia complications

## 2013-07-04 NOTE — Progress Notes (Signed)
Post Partum Day 1 Subjective: Reports feeling well.  Ambulating, voiding and tol po liquids and solids without difficulty.  Denies weakness or dizziness.  Pos flatus, neg BM.  Pumping breastmilk without difficulty at present as well as formula feeding infant.    Objective: Blood pressure 103/65, pulse 78, temperature 98.6 F (37 C), temperature source Oral, resp. rate 18, height 5\' 4"  (1.626 m), weight 78.019 kg (172 lb), last menstrual period 03/28/2012, SpO2 98.00%, unknown if currently breastfeeding.  Physical Exam:  General: alert, cooperative and no distress Heart:  RRR Lungs:  CTA bilat Breasts:  Soft Lochia: appropriate, sm rubra Uterine Fundus: firm, NT 1 below umb Incision: Perineum well approximated and repair intact DVT Evaluation: No evidence of DVT seen on physical exam. Negative Homan's sign bilat No significant calf/ankle edema.   Recent Labs  07/03/13 1120 07/04/13 0609  HGB 11.8* 10.0*  HCT 34.3* 29.1*    Assessment/Plan: Stable s/p vag delivery at 38w 2d  Anticipate discharge tomorrow. Continue current plan of care.    LOS: 1 day   Dondi Burandt O. 07/04/2013, 4:40 PM

## 2013-07-05 NOTE — Discharge Summary (Signed)
  Vaginal Delivery Discharge Summary  Linda Swanson  DOB:    01/15/1991 MRN:    [id number] CSN:    [id number]  Date of admission:                  07/03/2013  Date of discharge:                   07/05/2013  Procedures this admission:  Date of Delivery: 07/03/2013  Newborn Data:  Live born female  Birth Weight: 6 lb 10.2 oz (3010 g) APGAR: 8, 9  Home with mother.   History of Present Illness:  Linda Swanson is a 22 y.o. female, G2P2002, who presents at [redacted]w[redacted]d weeks gestation. The patient has been followed at the [facility] division of [facility]. She was admitted onset of labor. Her pregnancy has been complicated by: none.  Hospital course:  The patient was admitted for labor.   Her labor was not complicated. She proceeded to have a vaginal delivery of a healthy infant by S.Cinch Ormond, CNM . Her delivery was not complicated. Her postpartum course was not complicated.  She was discharged to home on postpartum day 2 doing well.  Feeding:  breast  Contraception:  undecided  Discharge hemoglobin:  Hemoglobin  Date Value Range Status  07/04/2013 10.0* 12.0 - 15.0 g/dL Final     HCT  Date Value Range Status  07/04/2013 29.1* 36.0 - 46.0 % Final    Discharge Physical Exam:   General: alert and no distress Lochia: appropriate Uterine Fundus: firm Incision: healing well DVT Evaluation: No evidence of DVT seen on physical exam.  Intrapartum Procedures: spontaneous vaginal delivery, GBS prophylaxis and labor epidural  Postpartum Procedures: none Complications-Operative and Postpartum: none 1st degree perineal and R labial laceration   Discharge Diagnoses: Term Pregnancy-delivered  Discharge Information:  Activity:           pelvic rest Diet:                routine Medications: PNV and Ibuprofen Condition:      stable Instructions:  refer to printed instructions Discharge to: home  Follow-up Information   Follow up with [facility] In 6 weeks.   Contact information:   [address] [place] [address] [phone]       Linda Swanson 07/05/2013

## 2013-07-07 NOTE — Progress Notes (Signed)
Post discharge review completed per request. 

## 2014-01-28 ENCOUNTER — Encounter (HOSPITAL_COMMUNITY): Payer: Self-pay | Admitting: Emergency Medicine

## 2014-01-28 ENCOUNTER — Emergency Department (HOSPITAL_COMMUNITY)
Admission: EM | Admit: 2014-01-28 | Discharge: 2014-01-28 | Discharge status: Against Medical Advice | Payer: Managed Care, Other (non HMO) | Attending: Emergency Medicine | Admitting: Emergency Medicine

## 2014-01-28 DIAGNOSIS — Z87891 Personal history of nicotine dependence: Secondary | ICD-10-CM | POA: Insufficient documentation

## 2014-01-28 DIAGNOSIS — J45909 Unspecified asthma, uncomplicated: Secondary | ICD-10-CM | POA: Insufficient documentation

## 2014-01-28 DIAGNOSIS — N898 Other specified noninflammatory disorders of vagina: Secondary | ICD-10-CM | POA: Insufficient documentation

## 2014-01-28 NOTE — ED Notes (Signed)
Pt called for reassessment and vitals with no answer.

## 2014-01-28 NOTE — ED Notes (Signed)
PT called for revital and reassessment; no answer.

## 2014-01-28 NOTE — ED Notes (Signed)
Pt reports having her menstrual cycle since Monday, having heavier bleeding than normal and more lower abd pain and cramping. No acute distress noted at triage.

## 2014-09-20 ENCOUNTER — Encounter (HOSPITAL_COMMUNITY): Payer: Self-pay | Admitting: Emergency Medicine
